# Patient Record
Sex: Female | Born: 1980 | Race: Black or African American | Hispanic: No | Marital: Single | Smoking: Former smoker
Health system: Southern US, Community
[De-identification: ages and names within clinical notes are randomized; demographics above are authoritative.]

## PROBLEM LIST (undated history)

## (undated) DIAGNOSIS — O24419 Gestational diabetes mellitus in pregnancy, unspecified control: Secondary | ICD-10-CM

## (undated) DIAGNOSIS — O039 Complete or unspecified spontaneous abortion without complication: Secondary | ICD-10-CM

## (undated) DIAGNOSIS — Z8619 Personal history of other infectious and parasitic diseases: Secondary | ICD-10-CM

## (undated) HISTORY — DX: Gestational diabetes mellitus in pregnancy, unspecified control: O24.419

## (undated) HISTORY — DX: Personal history of other infectious and parasitic diseases: Z86.19

## (undated) HISTORY — PX: DILATION AND CURETTAGE OF UTERUS: SHX78

---

## 2008-01-05 ENCOUNTER — Emergency Department (HOSPITAL_COMMUNITY): Admission: EM | Admit: 2008-01-05 | Discharge: 2008-01-06 | Payer: Self-pay | Admitting: Emergency Medicine

## 2008-01-08 ENCOUNTER — Emergency Department (HOSPITAL_COMMUNITY): Admission: EM | Admit: 2008-01-08 | Discharge: 2008-01-08 | Payer: Self-pay | Admitting: Emergency Medicine

## 2008-02-05 ENCOUNTER — Ambulatory Visit (HOSPITAL_COMMUNITY): Admission: RE | Admit: 2008-02-05 | Discharge: 2008-02-05 | Payer: Self-pay | Admitting: Obstetrics

## 2008-03-05 ENCOUNTER — Ambulatory Visit (HOSPITAL_COMMUNITY): Admission: RE | Admit: 2008-03-05 | Discharge: 2008-03-05 | Payer: Self-pay | Admitting: Obstetrics

## 2008-04-02 ENCOUNTER — Ambulatory Visit (HOSPITAL_COMMUNITY): Admission: RE | Admit: 2008-04-02 | Discharge: 2008-04-02 | Payer: Self-pay | Admitting: Obstetrics

## 2008-05-12 ENCOUNTER — Encounter: Admission: RE | Admit: 2008-05-12 | Discharge: 2008-05-12 | Payer: Self-pay | Admitting: Obstetrics

## 2008-06-28 ENCOUNTER — Ambulatory Visit (HOSPITAL_COMMUNITY): Admission: RE | Admit: 2008-06-28 | Discharge: 2008-06-28 | Payer: Self-pay | Admitting: Obstetrics

## 2008-07-03 ENCOUNTER — Inpatient Hospital Stay (HOSPITAL_COMMUNITY): Admission: AD | Admit: 2008-07-03 | Discharge: 2008-07-06 | Payer: Self-pay | Admitting: Obstetrics and Gynecology

## 2009-10-30 ENCOUNTER — Ambulatory Visit (HOSPITAL_COMMUNITY): Admission: RE | Admit: 2009-10-30 | Discharge: 2009-10-30 | Payer: Self-pay | Admitting: Obstetrics

## 2010-10-13 LAB — CBC
HCT: 37.7 % (ref 36.0–46.0)
Hemoglobin: 12.8 g/dL (ref 12.0–15.0)
Platelets: 236 10*3/uL (ref 150–400)
RBC: 4.17 MIL/uL (ref 3.87–5.11)
WBC: 6.4 10*3/uL (ref 4.0–10.5)

## 2010-10-13 LAB — TYPE AND SCREEN: Antibody Screen: NEGATIVE

## 2010-12-07 NOTE — H&P (Signed)
NAMERILEI, KRAVITZ               ACCOUNT NO.:  [id number]   MEDICAL RECORD NO.:  [id number]          PATIENT TYPE:  INP   LOCATION:  9175                          FACILITY:  [facility]   PHYSICIAN:  Lenoard Aden, M.D.DATE OF BIRTH:  04-30-1981   DATE OF ADMISSION:  07/03/2008  DATE OF DISCHARGE:                              HISTORY & PHYSICAL   CHIEF COMPLAINT:  A 39 weeks' gestation, diabetes, borderline  oligohydramnios, for induction.   She is a 30 year old white female G2, P0, now 41 weeks' gestation with  gestational diabetes, borderline oligohydramnios for cervical ripening  induction.   ALLERGIES:  She has no known drug allergies.   MEDICATIONS:  Prenatal vitamins and Glyburide, which she has not taken  for 2 days.  She is a smoker of less than a pack a day.  She denies  domestic or physical violence.   FAMILY HISTORY:  Diabetes and hypertension.   She has history of one uncomplicated spontaneous abortion with D&C in  2008.   PHYSICAL EXAMINATION:  GENERAL:  She is a well-developed, well-nourished  [place] female in no acute distress.  HEENT:  Normal.  LUNGS:  Clear.  HEART:  Regular rate and rhythm.  ABDOMEN:  Soft, gravid, and nontender. Estimated fetal weight is 7  pounds. Cervix is 2, 50%, vertex, -1.  EXTREMITIES:  There are no cords.  NEUROLOGIC:  Nonfocal.  SKIN:  Intact.   Cytotec to be placed.  NSD is reactive.   IMPRESSION:  1. A 39 weeks' obstetrics.  2. Gestational diabetes.  3. Borderline oligohydramnios.   PLAN:  Proceed with cervical ripening induction, anticipated type is  vaginal.     Lenoard Aden, M.D.  Electronically Signed    RJT/MEDQ  D:  07/03/2008  T:  07/04/2008  Job:  [id number]

## 2011-04-29 LAB — CBC
Hemoglobin: 10.7 g/dL — ABNORMAL LOW (ref 12.0–15.0)
Hemoglobin: 13.1 g/dL (ref 12.0–15.0)
Platelets: 157 10*3/uL (ref 150–400)
RBC: 4.16 MIL/uL (ref 3.87–5.11)
RDW: 13.2 % (ref 11.5–15.5)
RDW: 13.4 % (ref 11.5–15.5)
WBC: 9 10*3/uL (ref 4.0–10.5)

## 2011-04-29 LAB — GLUCOSE, CAPILLARY
Glucose-Capillary: 103 mg/dL — ABNORMAL HIGH (ref 70–99)
Glucose-Capillary: 108 mg/dL — ABNORMAL HIGH (ref 70–99)
Glucose-Capillary: 128 mg/dL — ABNORMAL HIGH (ref 70–99)
Glucose-Capillary: 88 mg/dL (ref 70–99)
Glucose-Capillary: 90 mg/dL (ref 70–99)

## 2012-08-22 ENCOUNTER — Ambulatory Visit: Payer: 59 | Admitting: Emergency Medicine

## 2012-08-22 VITALS — BP 112/76 | HR 82 | Temp 98.0°F | Resp 16 | Ht 66.5 in | Wt 173.0 lb

## 2012-08-22 DIAGNOSIS — Z3490 Encounter for supervision of normal pregnancy, unspecified, unspecified trimester: Secondary | ICD-10-CM

## 2012-08-22 DIAGNOSIS — N760 Acute vaginitis: Secondary | ICD-10-CM

## 2012-08-22 DIAGNOSIS — Z331 Pregnant state, incidental: Secondary | ICD-10-CM

## 2012-08-22 DIAGNOSIS — R350 Frequency of micturition: Secondary | ICD-10-CM

## 2012-08-22 LAB — POCT UA - MICROSCOPIC ONLY
Casts, Ur, LPF, POC: NEGATIVE
Crystals, Ur, HPF, POC: NEGATIVE
Epithelial cells, urine per micros: NEGATIVE
Mucus, UA: NEGATIVE
Yeast, UA: NEGATIVE

## 2012-08-22 LAB — POCT WET PREP WITH KOH

## 2012-08-22 LAB — POCT URINALYSIS DIPSTICK
Bilirubin, UA: NEGATIVE
Glucose, UA: NEGATIVE
Ketones, UA: NEGATIVE
Leukocytes, UA: NEGATIVE
Nitrite, UA: NEGATIVE
Protein, UA: NEGATIVE
Spec Grav, UA: 1.025
Urobilinogen, UA: 1
pH, UA: 6.5

## 2012-08-22 MED ORDER — METRONIDAZOLE 500 MG PO TABS: 500.0000 mg | ORAL_TABLET | Freq: Two times a day (BID) | ORAL | Status: DC

## 2012-08-22 MED ORDER — PRENATAL MULTIVIT-MIN-FE-FA 0.8 MG PO TABS: 1.0000 | ORAL_TABLET | Freq: Every day | ORAL | Status: AC

## 2012-08-22 NOTE — Progress Notes (Signed)
[facility] [address] [place] [address] [phone]  Date:  08/22/2012   Name:  Jasmin Mitchell   DOB:  [birth year]   MRN:  [id number]  PCP:  No primary provider on file.    Chief Complaint: Vaginal Discharge, Urinary Frequency and Vaginal Itching   History of Present Illness:  Jasmin Mitchell is a 32 y.o. very pleasant female patient who presents with the following:  Three week history of white discharge and itching and dysuria. Used a monistat insert and improved but returned.  Has frequent urination.  No fever or chills, abdominal pain, nausea or vomiting.  LMP 12/20.   Uses foam for contraception.  Sexually active since menses.    There is no problem list on file for this patient.   No past medical history on file.  No past surgical history on file.  History  Substance Use Topics  . Smoking status: Current Every Day Smoker  . Smokeless tobacco: Not on file  . Alcohol Use: Yes    No family history on file.  No Known Allergies  Medication list has been reviewed and updated.  No current outpatient prescriptions on file prior to visit.    Review of Systems:  As per HPI, otherwise negative.    Physical Examination: Filed Vitals:   08/22/12 1447  BP: 112/76  Pulse: 82  Temp: 98 F (36.7 C)  Resp: 16   Filed Vitals:   08/22/12 1447  Height: 5' 6.5" (1.689 m)  Weight: 173 lb (78.472 kg)   Body mass index is 27.50 kg/(m^2). Ideal Body Weight: Weight in (lb) to have BMI = 25: 156.9   GEN: WDWN, NAD, Non-toxic, A & O x 3 HEENT: Atraumatic, Normocephalic. Neck supple. No masses, No LAD. Ears and Nose: No external deformity. CV: RRR, No M/G/R. No JVD. No thrill. No extra heart sounds. PULM: CTA B, no wheezes, crackles, rhonchi. No retractions. No resp. distress. No accessory muscle use. ABD: S, NT, ND, +BS. No rebound. No HSM. EXTR: No c/c/e NEURO Normal gait.  PSYCH: Normally interactive. Conversant. Not depressed or anxious  appearing.  Calm demeanor.  Pelvic - normal external genitalia, vulva, vagina, cervix, uterus and adnexa.  Thick white discharge   Assessment and Plan: Pregnancy BV Flagyl Prenatal vitamins  Carmelina Dane, MD  Results for orders placed in visit on 08/22/12  POCT URINALYSIS DIPSTICK      Component Value Range   Color, UA yellow     Clarity, UA clear     Glucose, UA neg     Bilirubin, UA neg     Ketones, UA neg     Spec Grav, UA 1.025     Blood, UA trace     pH, UA 6.5     Protein, UA neg     Urobilinogen, UA 1.0     Nitrite, UA neg     Leukocytes, UA Negative    POCT UA - MICROSCOPIC ONLY      Component Value Range   WBC, Ur, HPF, POC 0-1     RBC, urine, microscopic 3-5     Bacteria, U Microscopic 1+     Mucus, UA neg     Epithelial cells, urine per micros neg     Crystals, Ur, HPF, POC neg     Casts, Ur, LPF, POC neg     Yeast, UA neg     Results for orders placed in visit on 08/22/12  POCT  URINALYSIS DIPSTICK      Component Value Range   Color, UA yellow     Clarity, UA clear     Glucose, UA neg     Bilirubin, UA neg     Ketones, UA neg     Spec Grav, UA 1.025     Blood, UA trace     pH, UA 6.5     Protein, UA neg     Urobilinogen, UA 1.0     Nitrite, UA neg     Leukocytes, UA Negative    POCT UA - MICROSCOPIC ONLY      Component Value Range   WBC, Ur, HPF, POC 0-1     RBC, urine, microscopic 3-5     Bacteria, U Microscopic 1+     Mucus, UA neg     Epithelial cells, urine per micros neg     Crystals, Ur, HPF, POC neg     Casts, Ur, LPF, POC neg     Yeast, UA neg    POCT URINE PREGNANCY      Component Value Range   Preg Test, Ur Positive    POCT WET PREP WITH KOH      Component Value Range   Trichomonas, UA Negative     Clue Cells Wet Prep HPF POC 5-8     Epithelial Wet Prep HPF POC 3-6     Yeast Wet Prep HPF POC neg     Bacteria Wet Prep HPF POC 5+     RBC Wet Prep HPF POC 0-3     WBC Wet Prep HPF POC tntc     KOH Prep POC Negative

## 2012-08-22 NOTE — Patient Instructions (Addendum)
Bacterial Vaginosis Bacterial vaginosis (BV) is a vaginal infection where the normal balance of bacteria in the vagina is disrupted. The normal balance is then replaced by an overgrowth of certain bacteria. There are several different kinds of bacteria that can cause BV. BV is the most common vaginal infection in women of childbearing age. CAUSES   The cause of BV is not fully understood. BV develops when there is an increase or imbalance of harmful bacteria.  Some activities or behaviors can upset the normal balance of bacteria in the vagina and put women at increased risk including:  Having a new sex partner or multiple sex partners.  Douching.  Using an intrauterine device (IUD) for contraception.  It is not clear what role sexual activity plays in the development of BV. However, women that have never had sexual intercourse are rarely infected with BV. Women do not get BV from toilet seats, bedding, swimming pools or from touching objects around them.  SYMPTOMS   Grey vaginal discharge.  A fish-like odor with discharge, especially after sexual intercourse.  Itching or burning of the vagina and vulva.  Burning or pain with urination.  Some women have no signs or symptoms at all. DIAGNOSIS  Your caregiver must examine the vagina for signs of BV. Your caregiver will perform lab tests and look at the sample of vaginal fluid through a microscope. They will look for bacteria and abnormal cells (clue cells), a pH test higher than 4.5, and a positive amine test all associated with BV.  RISKS AND COMPLICATIONS   Pelvic inflammatory disease (PID).  Infections following gynecology surgery.  Developing HIV.  Developing herpes virus. TREATMENT  Sometimes BV will clear up without treatment. However, all women with symptoms of BV should be treated to avoid complications, especially if gynecology surgery is planned. Female partners generally do not need to be treated. However, BV may spread  between female sex partners so treatment is helpful in preventing a recurrence of BV.   BV may be treated with antibiotics. The antibiotics come in either pill or vaginal cream forms. Either can be used with nonpregnant or pregnant women, but the recommended dosages differ. These antibiotics are not harmful to the baby.  BV can recur after treatment. If this happens, a second round of antibiotics will often be prescribed.  Treatment is important for pregnant women. If not treated, BV can cause a premature delivery, especially for a pregnant woman who had a premature birth in the past. All pregnant women who have symptoms of BV should be checked and treated.  For chronic reoccurrence of BV, treatment with a type of prescribed gel vaginally twice a week is helpful. HOME CARE INSTRUCTIONS   Finish all medication as directed by your caregiver.  Do not have sex until treatment is completed.  Tell your sexual partner that you have a vaginal infection. They should see their caregiver and be treated if they have problems, such as a mild rash or itching.  Practice safe sex. Use condoms. Only have 1 sex partner. PREVENTION  Basic prevention steps can help reduce the risk of upsetting the natural balance of bacteria in the vagina and developing BV:  Do not have sexual intercourse (be abstinent).  Do not douche.  Use all of the medicine prescribed for treatment of BV, even if the signs and symptoms go away.  Tell your sex partner if you have BV. That way, they can be treated, if needed, to prevent reoccurrence. SEEK MEDICAL CARE IF:     Your symptoms are not improving after 3 days of treatment.  You have increased discharge, pain, or fever. MAKE SURE YOU:   Understand these instructions.  Will watch your condition.  Will get help right away if you are not doing well or get worse. FOR MORE INFORMATION  Division of STD Prevention (DSTDP), Centers for Disease Control and Prevention:  www.cdc.gov/std American Social Health Association (ASHA): www.ashastd.org  Document Released: 07/11/2005 Document Revised: 10/03/2011 Document Reviewed: 01/01/2009 ExitCare Patient Information 2013 [employer].  

## 2012-08-23 NOTE — Progress Notes (Signed)
Reviewed and agree.

## 2012-08-24 LAB — URINE CULTURE: Colony Count: >=100000

## 2012-08-27 MED ORDER — SULFAMETHOXAZOLE-TRIMETHOPRIM 800-160 MG PO TABS: 1.0000 | ORAL_TABLET | Freq: Two times a day (BID) | ORAL | Status: DC

## 2012-09-17 ENCOUNTER — Emergency Department (HOSPITAL_COMMUNITY)
Admission: EM | Admit: 2012-09-17 | Discharge: 2012-09-17 | Disposition: A | Discharge status: Home / Self Care | Payer: 59 | Attending: Emergency Medicine | Admitting: Emergency Medicine

## 2012-09-17 ENCOUNTER — Emergency Department (HOSPITAL_COMMUNITY): Payer: 59

## 2012-09-17 ENCOUNTER — Encounter (HOSPITAL_COMMUNITY): Payer: Self-pay | Admitting: Emergency Medicine

## 2012-09-17 DIAGNOSIS — Z87891 Personal history of nicotine dependence: Secondary | ICD-10-CM | POA: Insufficient documentation

## 2012-09-17 DIAGNOSIS — O031 Delayed or excessive hemorrhage following incomplete spontaneous abortion: Secondary | ICD-10-CM | POA: Insufficient documentation

## 2012-09-17 HISTORY — DX: Complete or unspecified spontaneous abortion without complication: O03.9

## 2012-09-17 LAB — TYPE AND SCREEN
ABO/RH(D): O POS
Antibody Screen: NEGATIVE

## 2012-09-17 LAB — ABO/RH

## 2012-09-17 NOTE — ED Notes (Signed)
Patient transported to Ultrasound 

## 2012-09-17 NOTE — ED Provider Notes (Signed)
History     CSN: [id number]  Arrival date & time 09/17/12  1906   None     Chief Complaint  Patient presents with  . Vaginal Bleeding/Pregnant     (Consider location/radiation/quality/duration/timing/severity/associated sxs/prior treatment) HPI History provided by pt.   Based on patient's LMP, she is approximately 2 months pregnant.  Has not been evaluated by OB or had an [place] to verify IUP.  Developed vaginal spotting yesterday and has been bleeding more heavily today, soaking through 3 pads.  Developed mild, diffuse lower abdominal cramping this morning.  Has not taken anything for pain.  No associated fever, N/V/D, urinary or other vaginal sx.  Pt is G3P1, with a missed abortion in 2009.  Otherwise healthy.  Past Medical History  Diagnosis Date  . Miscarriage     Past Surgical History  Procedure Laterality Date  . Dilation and curettage of uterus      No family history on file.  History  Substance Use Topics  . Smoking status: Former Games developer  . Smokeless tobacco: Not on file  . Alcohol Use: Yes    OB History   Grav Para Term Preterm Abortions TAB SAB Ect Mult Living                  Review of Systems  All other systems reviewed and are negative.    Allergies  Review of patient's allergies indicates no known allergies.  Home Medications   Current Outpatient Rx  Name  Route  Sig  Dispense  Refill  . Prenatal Multivit-Min-Fe-FA 0.8 MG TABS   Oral   Take 1 tablet (0.8 mg total) by mouth daily.   100 each   3     BP 103/65  Pulse 61  Temp(Src) 98.4 F (36.9 C) (Oral)  SpO2 100%  LMP 07/11/2012  Physical Exam  Nursing note and vitals reviewed. Constitutional: She is oriented to person, place, and time. She appears well-developed and well-nourished. No distress.  HENT:  Head: Normocephalic and atraumatic.  Eyes:  Normal appearance  Neck: Normal range of motion.  Cardiovascular: Normal rate and regular rhythm.   Pulmonary/Chest: Effort normal and  breath sounds normal. No respiratory distress.  Abdominal: Soft. Bowel sounds are normal. She exhibits no distension and no mass. There is no rebound and no guarding.  Diffuse, mild lower abd and suprapubic ttp  Genitourinary:  No CVA tenderness  Musculoskeletal: Normal range of motion.  Neurological: She is alert and oriented to person, place, and time.  Skin: Skin is warm and dry. No rash noted.  Psychiatric: She has a normal mood and affect. Her behavior is normal.    ED Course  Procedures (including critical care time)  Labs Reviewed  HCG, QUANTITATIVE, PREGNANCY - Abnormal; Notable for the following:    hCG, Beta Chain, Quant, S 3445 (*)    All other components within normal limits  URINALYSIS, ROUTINE W REFLEX MICROSCOPIC  TYPE AND SCREEN  ABO/RH  SURGICAL PATHOLOGY   US Ob Comp Less 14 Wks  09/17/2012  *RADIOLOGY REPORT*  Clinical Data: 32 year old pregnant female with vaginal spotting/bleeding.  Estimated gestational age of [redacted] weeks 3 days by LMP.  OBSTETRIC <14 WK [place] AND TRANSVAGINAL OB US  Technique:  Both transabdominal and transvaginal ultrasound examinations were performed for complete evaluation of the gestation as well as the maternal uterus, adnexal regions, and pelvic cul-de-sac.  Transvaginal technique was performed to assess early pregnancy.  Comparison:  None  Intrauterine gestational sac:  Visualized  but within the lower uterine segment near the cervix Yolk sac: Present Embryo: Not visualized  MSD: 11.8 mm  6 w 0 d     [place] EDC: 05/13/2013  Maternal uterus/adnexae: There is no evidence of subchorionic hemorrhage. The ovaries bilaterally are unremarkable.  IMPRESSION: Single intrauterine gestational sac with yolk sac, but located within the lower uterine segment near the cervix - poor prognostic sign. No embryo identified at this time.  Estimated gestational age of [redacted] weeks 0 days by this ultrasound.   Original Report Authenticated By: Harmon Pier, M.D.    US Ob  Transvaginal  09/17/2012  *RADIOLOGY REPORT*  Clinical Data: 32 year old pregnant female with vaginal spotting/bleeding.  Estimated gestational age of [redacted] weeks 3 days by LMP.  OBSTETRIC <14 WK [place] AND TRANSVAGINAL OB US  Technique:  Both transabdominal and transvaginal ultrasound examinations were performed for complete evaluation of the gestation as well as the maternal uterus, adnexal regions, and pelvic cul-de-sac.  Transvaginal technique was performed to assess early pregnancy.  Comparison:  None  Intrauterine gestational sac:  Visualized but within the lower uterine segment near the cervix Yolk sac: Present Embryo: Not visualized  MSD: 11.8 mm  6 w 0 d     [place] EDC: 05/13/2013  Maternal uterus/adnexae: There is no evidence of subchorionic hemorrhage. The ovaries bilaterally are unremarkable.  IMPRESSION: Single intrauterine gestational sac with yolk sac, but located within the lower uterine segment near the cervix - poor prognostic sign. No embryo identified at this time.  Estimated gestational age of [redacted] weeks 0 days by this ultrasound.   Original Report Authenticated By: Harmon Pier, M.D.      1. Incomplete abortion       MDM  32yo pregnant F presents w/ vaginal bleeding and lower abdominal cramping.  Remote h/o miscarriage and otherwise healthy.  Has not had [place] to verify IUP.  On exam, NAD, VS w/in nml range, diffuse, mild lower abdominal cramping, products of conception at open cervical os and otherwise unremarkable genitalia.  Products of conception removed and sent to pathology.  [place] confirms MAB. Pt is Rh negative. Advised close f/u with her Ob/Gyn.  Return precautions discussed.  10:36 PM         Otilio Miu, PA-C 09/18/12 [phone]

## 2012-09-17 NOTE — ED Notes (Signed)
One specimen container with tissue carried to lab identified as products of conception by [place] PA

## 2012-09-17 NOTE — ED Notes (Signed)
Pt states she found out 2 weeks ago she was pregnant, she's about 2 months pregnant, started having dark red spotting yesterday and today has had bright red period type flow bleeding, changed total of 3 pads today, states having urinating had "stringy" like blood come out. Pt states having menstrual like cramps on and off. Has had miscarriage before which she states was a lot worse than this is, but she has a 30 yr old son at home also, total times of being pregnant is 3. Denies n/v/d.

## 2012-09-17 NOTE — ED Notes (Addendum)
Pt states that she found out she was pregnant a couple of weeks ago and started spotting yesterday and has had heavier bleeding today with some blood clots. Estimates that she is 2 months along. States this is her third pregnancy, 1 miscarriage and she has a son. Also c/o lower abdominal pain.

## 2012-09-18 ENCOUNTER — Encounter (HOSPITAL_COMMUNITY): Payer: Self-pay | Admitting: Emergency Medicine

## 2012-09-18 NOTE — ED Provider Notes (Signed)
Medical screening examination/treatment/procedure(s) were performed by non-physician practitioner and as supervising physician I was immediately available for consultation/collaboration.  Genisis Sonnier K Linker, MD 09/18/12 1509 

## 2012-12-26 ENCOUNTER — Other Ambulatory Visit: Payer: Self-pay | Admitting: Obstetrics and Gynecology

## 2012-12-26 ENCOUNTER — Other Ambulatory Visit (HOSPITAL_COMMUNITY)
Admission: RE | Admit: 2012-12-26 | Discharge: 2012-12-26 | Disposition: A | Discharge status: Home / Self Care | Payer: 59 | Source: Ambulatory Visit | Attending: Obstetrics and Gynecology | Admitting: Obstetrics and Gynecology

## 2012-12-26 DIAGNOSIS — Z01419 Encounter for gynecological examination (general) (routine) without abnormal findings: Secondary | ICD-10-CM | POA: Insufficient documentation

## 2014-01-07 ENCOUNTER — Other Ambulatory Visit: Payer: Self-pay | Admitting: Obstetrics and Gynecology

## 2014-01-07 ENCOUNTER — Other Ambulatory Visit (HOSPITAL_COMMUNITY)
Admission: RE | Admit: 2014-01-07 | Discharge: 2014-01-07 | Disposition: A | Discharge status: Home / Self Care | Payer: 59 | Source: Ambulatory Visit | Attending: Obstetrics and Gynecology | Admitting: Obstetrics and Gynecology

## 2014-01-07 DIAGNOSIS — Z1151 Encounter for screening for human papillomavirus (HPV): Secondary | ICD-10-CM | POA: Insufficient documentation

## 2014-01-07 DIAGNOSIS — Z01419 Encounter for gynecological examination (general) (routine) without abnormal findings: Secondary | ICD-10-CM | POA: Insufficient documentation

## 2014-01-07 DIAGNOSIS — Z113 Encounter for screening for infections with a predominantly sexual mode of transmission: Secondary | ICD-10-CM | POA: Insufficient documentation

## 2014-01-09 LAB — CYTOLOGY - PAP

## 2014-02-14 LAB — OB RESULTS CONSOLE RUBELLA ANTIBODY, IGM: Rubella: IMMUNE

## 2014-02-14 LAB — OB RESULTS CONSOLE ABO/RH: RH TYPE: POSITIVE

## 2014-02-14 LAB — OB RESULTS CONSOLE HIV ANTIBODY (ROUTINE TESTING): HIV: NONREACTIVE

## 2014-02-14 LAB — OB RESULTS CONSOLE ANTIBODY SCREEN: Antibody Screen: NEGATIVE

## 2014-02-14 LAB — OB RESULTS CONSOLE RPR: RPR: NONREACTIVE

## 2014-02-14 LAB — OB RESULTS CONSOLE HEPATITIS B SURFACE ANTIGEN: HEP B S AG: NEGATIVE

## 2014-02-14 LAB — OB RESULTS CONSOLE GC/CHLAMYDIA
Chlamydia: NEGATIVE
GC PROBE AMP, GENITAL: NEGATIVE

## 2014-07-09 ENCOUNTER — Encounter: Payer: 59 | Attending: Obstetrics and Gynecology

## 2014-07-09 VITALS — Ht 67.0 in | Wt 213.7 lb

## 2014-07-09 DIAGNOSIS — O24419 Gestational diabetes mellitus in pregnancy, unspecified control: Secondary | ICD-10-CM | POA: Diagnosis not present

## 2014-07-09 DIAGNOSIS — Z713 Dietary counseling and surveillance: Secondary | ICD-10-CM | POA: Diagnosis not present

## 2014-07-14 NOTE — Progress Notes (Signed)
  Patient was seen on 07/09/14 for Gestational Diabetes self-management . The following learning objectives were met by the patient :   States the definition of Gestational Diabetes  States why dietary management is important in controlling blood glucose  Describes the effects of carbohydrates on blood glucose levels  Demonstrates ability to create a balanced meal plan  Demonstrates carbohydrate counting   States when to check blood glucose levels  Demonstrates proper blood glucose monitoring techniques  States the effect of stress and exercise on blood glucose levels  States the importance of limiting caffeine and abstaining from alcohol and smoking  Plan:  Aim for 2 Carb Choices per meal (30 grams) +/- 1 either way for breakfast Aim for 3 Carb Choices per meal (45 grams) +/- 1 either way from lunch and dinner Aim for 1-2 Carbs per snack Begin reading food labels for Total Carbohydrate and sugar grams of foods Consider  increasing your activity level by walking daily as tolerated Begin checking BG before breakfast and 2 hours after first bit of breakfast, lunch and dinner after  as directed by MD  Take medication  as directed by MD  Blood glucose monitor given: None - initiated by OB/GYN due to GDM history  Patient instructed to monitor glucose levels: FBS: 60 - <90 2 hour: <120  Patient received the following handouts:  Nutrition Diabetes and Pregnancy  Carbohydrate Counting List  Meal Planning worksheet  Patient will be seen for follow-up as needed.

## 2014-07-25 NOTE — L&D Delivery Note (Signed)
Delivery Note At 5:34 PM a viable female was delivered via  (Presentation: occiput Anterior  ;  ).  APGAR: 8 and 9 at 1 and 5 minutes respectively , ; weight pending   .   Placenta status: delivered Intact, . 3 Vessel  Cord:  with the following complications: None.   Anesthesia: Epidural   Episiotomy:  None Lacerations:  Hemostatic periurethral no suture required  Suture Repair: NA Est. Blood Loss (mL):  200 mL   Mom to postpartum.  Baby to Couplet care / Skin to Skin   Patient desires circumcision of infant.  Jasmin Mitchell J. 09/12/2014, 5:53 PM

## 2014-08-26 LAB — OB RESULTS CONSOLE GBS: STREP GROUP B AG: NEGATIVE

## 2014-09-11 ENCOUNTER — Telehealth (HOSPITAL_COMMUNITY): Payer: Self-pay | Admitting: *Deleted

## 2014-09-11 ENCOUNTER — Other Ambulatory Visit: Payer: Self-pay | Admitting: Obstetrics and Gynecology

## 2014-09-11 ENCOUNTER — Encounter (HOSPITAL_COMMUNITY): Payer: Self-pay | Admitting: *Deleted

## 2014-09-11 NOTE — Telephone Encounter (Signed)
Preadmission screen  

## 2014-09-12 ENCOUNTER — Inpatient Hospital Stay (HOSPITAL_COMMUNITY)
Admission: RE | Admit: 2014-09-12 | Discharge: 2014-09-14 | DRG: 775 | Disposition: A | Discharge status: Home / Self Care | Payer: 59 | Source: Ambulatory Visit | Attending: Obstetrics and Gynecology | Admitting: Obstetrics and Gynecology

## 2014-09-12 ENCOUNTER — Inpatient Hospital Stay (HOSPITAL_COMMUNITY): Payer: 59 | Admitting: Anesthesiology

## 2014-09-12 ENCOUNTER — Encounter (HOSPITAL_COMMUNITY): Payer: Self-pay

## 2014-09-12 DIAGNOSIS — Z87891 Personal history of nicotine dependence: Secondary | ICD-10-CM | POA: Diagnosis not present

## 2014-09-12 DIAGNOSIS — Z79899 Other long term (current) drug therapy: Secondary | ICD-10-CM

## 2014-09-12 DIAGNOSIS — O24429 Gestational diabetes mellitus in childbirth, unspecified control: Principal | ICD-10-CM | POA: Diagnosis present

## 2014-09-12 DIAGNOSIS — O24419 Gestational diabetes mellitus in pregnancy, unspecified control: Secondary | ICD-10-CM | POA: Diagnosis present

## 2014-09-12 DIAGNOSIS — Z833 Family history of diabetes mellitus: Secondary | ICD-10-CM | POA: Diagnosis not present

## 2014-09-12 DIAGNOSIS — Z3A39 39 weeks gestation of pregnancy: Secondary | ICD-10-CM | POA: Diagnosis present

## 2014-09-12 LAB — GLUCOSE, CAPILLARY
GLUCOSE-CAPILLARY: 79 mg/dL (ref 70–99)
Glucose-Capillary: 95 mg/dL (ref 70–99)

## 2014-09-12 LAB — CBC
HCT: 38.2 % (ref 36.0–46.0)
Hemoglobin: 13.1 g/dL (ref 12.0–15.0)
MCH: 31.3 pg (ref 26.0–34.0)
MCHC: 34.3 g/dL (ref 30.0–36.0)
MCV: 91.2 fL (ref 78.0–100.0)
PLATELETS: 171 10*3/uL (ref 150–400)
RBC: 4.19 MIL/uL (ref 3.87–5.11)
RDW: 13 % (ref 11.5–15.5)
WBC: 7 10*3/uL (ref 4.0–10.5)

## 2014-09-12 LAB — TYPE AND SCREEN
ABO/RH(D): O POS
ANTIBODY SCREEN: NEGATIVE

## 2014-09-12 MED ORDER — BENZOCAINE-MENTHOL 20-0.5 % EX AERO
1.0000 "application " | INHALATION_SPRAY | CUTANEOUS | Status: DC | PRN
  Administered 2014-09-13: 1 via TOPICAL
  Filled 2014-09-12: qty 56

## 2014-09-12 MED ORDER — IBUPROFEN 600 MG PO TABS
600.0000 mg | ORAL_TABLET | Freq: Four times a day (QID) | ORAL | Status: DC
  Administered 2014-09-12 – 2014-09-14 (×7): 600 mg via ORAL
  Filled 2014-09-12 (×7): qty 1

## 2014-09-12 MED ORDER — EPHEDRINE 5 MG/ML INJ
10.0000 mg | INTRAVENOUS | Status: DC | PRN
  Filled 2014-09-12: qty 2

## 2014-09-12 MED ORDER — DIPHENHYDRAMINE HCL 50 MG/ML IJ SOLN: 12.5000 mg | INTRAMUSCULAR | Status: DC | PRN

## 2014-09-12 MED ORDER — DIBUCAINE 1 % RE OINT
1.0000 | TOPICAL_OINTMENT | RECTAL | Status: DC | PRN
Start: 2014-09-12 — End: 2014-09-14

## 2014-09-12 MED ORDER — ACETAMINOPHEN 325 MG PO TABS: 650.0000 mg | ORAL_TABLET | ORAL | Status: DC | PRN

## 2014-09-12 MED ORDER — DIPHENHYDRAMINE HCL 25 MG PO CAPS: 25.0000 mg | ORAL_CAPSULE | Freq: Four times a day (QID) | ORAL | Status: DC | PRN

## 2014-09-12 MED ORDER — OXYTOCIN 40 UNITS IN LACTATED RINGERS INFUSION - SIMPLE MED
1.0000 m[IU]/min | INTRAVENOUS | Status: DC
  Administered 2014-09-12: 2 m[IU]/min via INTRAVENOUS
  Filled 2014-09-12: qty 1000

## 2014-09-12 MED ORDER — ONDANSETRON HCL 4 MG/2ML IJ SOLN: 4.0000 mg | INTRAMUSCULAR | Status: DC | PRN

## 2014-09-12 MED ORDER — SENNOSIDES-DOCUSATE SODIUM 8.6-50 MG PO TABS
2.0000 | ORAL_TABLET | ORAL | Status: DC
  Administered 2014-09-13 (×2): 2 via ORAL
  Filled 2014-09-12 (×2): qty 2

## 2014-09-12 MED ORDER — METHYLERGONOVINE MALEATE 0.2 MG/ML IJ SOLN: 0.2000 mg | INTRAMUSCULAR | Status: DC | PRN

## 2014-09-12 MED ORDER — TERBUTALINE SULFATE 1 MG/ML IJ SOLN
0.2500 mg | Freq: Once | INTRAMUSCULAR | Status: DC | PRN
  Filled 2014-09-12: qty 1

## 2014-09-12 MED ORDER — ZOLPIDEM TARTRATE 5 MG PO TABS
5.0000 mg | ORAL_TABLET | Freq: Every evening | ORAL | Status: DC | PRN
Start: 2014-09-12 — End: 2014-09-14

## 2014-09-12 MED ORDER — WITCH HAZEL-GLYCERIN EX PADS
1.0000 | MEDICATED_PAD | CUTANEOUS | Status: DC | PRN
Start: 2014-09-12 — End: 2014-09-14

## 2014-09-12 MED ORDER — LACTATED RINGERS IV SOLN
500.0000 mL | Freq: Once | INTRAVENOUS | Status: AC
  Administered 2014-09-12: 500 mL via INTRAVENOUS

## 2014-09-12 MED ORDER — ONDANSETRON HCL 4 MG PO TABS: 4.0000 mg | ORAL_TABLET | ORAL | Status: DC | PRN

## 2014-09-12 MED ORDER — PHENYLEPHRINE 40 MCG/ML (10ML) SYRINGE FOR IV PUSH (FOR BLOOD PRESSURE SUPPORT)
80.0000 ug | PREFILLED_SYRINGE | INTRAVENOUS | Status: DC | PRN
  Filled 2014-09-12: qty 2

## 2014-09-12 MED ORDER — OXYCODONE-ACETAMINOPHEN 5-325 MG PO TABS: 2.0000 | ORAL_TABLET | ORAL | Status: DC | PRN

## 2014-09-12 MED ORDER — LACTATED RINGERS IV SOLN
INTRAVENOUS | Status: DC
  Administered 2014-09-12 (×2): via INTRAVENOUS

## 2014-09-12 MED ORDER — LACTATED RINGERS IV SOLN
INTRAVENOUS | Status: DC
Start: 2014-09-12 — End: 2014-09-12

## 2014-09-12 MED ORDER — ONDANSETRON HCL 4 MG/2ML IJ SOLN: 4.0000 mg | Freq: Four times a day (QID) | INTRAMUSCULAR | Status: DC | PRN

## 2014-09-12 MED ORDER — SIMETHICONE 80 MG PO CHEW: 80.0000 mg | CHEWABLE_TABLET | ORAL | Status: DC | PRN

## 2014-09-12 MED ORDER — BUTORPHANOL TARTRATE 1 MG/ML IJ SOLN: 1.0000 mg | INTRAMUSCULAR | Status: DC | PRN

## 2014-09-12 MED ORDER — OXYCODONE-ACETAMINOPHEN 5-325 MG PO TABS: 1.0000 | ORAL_TABLET | ORAL | Status: DC | PRN

## 2014-09-12 MED ORDER — FENTANYL 2.5 MCG/ML BUPIVACAINE 1/10 % EPIDURAL INFUSION (WH - ANES)
14.0000 mL/h | INTRAMUSCULAR | Status: DC | PRN
  Administered 2014-09-12: 14 mL/h via EPIDURAL
  Filled 2014-09-12: qty 125

## 2014-09-12 MED ORDER — LACTATED RINGERS IV SOLN: 500.0000 mL | INTRAVENOUS | Status: DC | PRN

## 2014-09-12 MED ORDER — FENTANYL 2.5 MCG/ML BUPIVACAINE 1/10 % EPIDURAL INFUSION (WH - ANES)
INTRAMUSCULAR | Status: DC | PRN
  Administered 2014-09-12: 14 mL/h via EPIDURAL

## 2014-09-12 MED ORDER — LIDOCAINE HCL (PF) 1 % IJ SOLN
INTRAMUSCULAR | Status: DC | PRN
  Administered 2014-09-12 (×2): 8 mL

## 2014-09-12 MED ORDER — PRENATAL MULTIVITAMIN CH
1.0000 | ORAL_TABLET | Freq: Every day | ORAL | Status: DC
  Administered 2014-09-13: 1 via ORAL
  Filled 2014-09-12: qty 1

## 2014-09-12 MED ORDER — CITRIC ACID-SODIUM CITRATE 334-500 MG/5ML PO SOLN: 30.0000 mL | ORAL | Status: DC | PRN

## 2014-09-12 MED ORDER — OXYTOCIN 40 UNITS IN LACTATED RINGERS INFUSION - SIMPLE MED: 62.5000 mL/h | INTRAVENOUS | Status: DC

## 2014-09-12 MED ORDER — LIDOCAINE HCL (PF) 1 % IJ SOLN
30.0000 mL | INTRAMUSCULAR | Status: DC | PRN
  Filled 2014-09-12: qty 30

## 2014-09-12 MED ORDER — OXYCODONE-ACETAMINOPHEN 5-325 MG PO TABS
1.0000 | ORAL_TABLET | ORAL | Status: DC | PRN
Start: 2014-09-12 — End: 2014-09-14

## 2014-09-12 MED ORDER — PHENYLEPHRINE 40 MCG/ML (10ML) SYRINGE FOR IV PUSH (FOR BLOOD PRESSURE SUPPORT)
80.0000 ug | PREFILLED_SYRINGE | INTRAVENOUS | Status: DC | PRN
  Filled 2014-09-12: qty 20
  Filled 2014-09-12: qty 2

## 2014-09-12 MED ORDER — LANOLIN HYDROUS EX OINT: TOPICAL_OINTMENT | CUTANEOUS | Status: DC | PRN

## 2014-09-12 MED ORDER — METHYLERGONOVINE MALEATE 0.2 MG PO TABS: 0.2000 mg | ORAL_TABLET | ORAL | Status: DC | PRN

## 2014-09-12 MED ORDER — OXYTOCIN BOLUS FROM INFUSION: 500.0000 mL | INTRAVENOUS | Status: DC

## 2014-09-12 NOTE — Anesthesia Preprocedure Evaluation (Signed)
Anesthesia Evaluation  Patient identified by MRN, date of birth, ID band Patient awake    Reviewed: Allergy & Precautions, H&P , NPO status , Patient's Chart, lab work & pertinent test results  Airway Mallampati: I  TM Distance: >3 FB Neck ROM: full    Dental no notable dental hx.    Pulmonary former smoker,    Pulmonary exam normal       Cardiovascular negative cardio ROS      Neuro/Psych negative neurological ROS  negative psych ROS   GI/Hepatic negative GI ROS, Neg liver ROS,   Endo/Other  diabetes, Well Controlled, Gestational  Renal/GU negative Renal ROS     Musculoskeletal   Abdominal (+) + obese,   Peds  Hematology negative hematology ROS (+)   Anesthesia Other Findings   Reproductive/Obstetrics (+) Pregnancy                             Anesthesia Physical Anesthesia Plan  ASA: II  Anesthesia Plan: Epidural   Post-op Pain Management:    Induction:   Airway Management Planned:   Additional Equipment:   Intra-op Plan:   Post-operative Plan:   Informed Consent: I have reviewed the patients History and Physical, chart, labs and discussed the procedure including the risks, benefits and alternatives for the proposed anesthesia with the patient or authorized representative who has indicated his/her understanding and acceptance.     Plan Discussed with:   Anesthesia Plan Comments:         Anesthesia Quick Evaluation

## 2014-09-12 NOTE — Progress Notes (Signed)
Jasmin Mitchell is a 34 y.o. [id number] at 5786w1d  admitted for induction of labor due to Gestational diabetes.  Subjective:  pt is comfortable with her epidural   Objective: BP 125/69 mmHg  Pulse 85  Temp(Src) 97.7 F (36.5 C) (Oral)  Resp 20  Ht 5\' 7"  (1.702 m)  Wt 100.699 kg (222 lb)  BMI 34.76 kg/m2  SpO2 100%  LMP 12/12/2013      FHT:  FHR: 140 bpm, variability: moderate,  accelerations:  Present,  decelerations:  Absent UC:   regular, every 2 minutes SVE:  3.5/75/-2 AROM clear fluid IUPC placed  Labs: Lab Results  Component Value Date   WBC 7.0 09/12/2014   HGB 13.1 09/12/2014   HCT 38.2 09/12/2014   MCV 91.2 09/12/2014   PLT 171 09/12/2014    Assessment / Plan: Induction of labor due to gestational diabetes,  progressing well on pitocin  Labor: progressing on pitocin  Preeclampsia:  NA Fetal Wellbeing:  Category I Pain Control:  Epidural I/D:  n/a Anticipated MOD:  NSVD  Jasmin Mitchell J. 09/12/2014, 5:51 PM

## 2014-09-12 NOTE — H&P (Signed)
Jasmin Mitchell is a 34 y.o. female presenting for induction of labor at 39 wks and 1 day due to gestational diabetes controlled with Glyburide.  + FM no lof no vaginal bleeding no contractions.     History OB History    Gravida Para Term Preterm AB TAB SAB Ectopic Multiple Living   4 1 1  2  2   1      Past Medical History  Diagnosis Date  . Miscarriage   . Gestational diabetes mellitus, antepartum   . Hx of chlamydia infection   . Hx of varicella   . Gestational diabetes    Past Surgical History  Procedure Laterality Date  . Dilation and curettage of uterus    . Dilation and curettage of uterus     Family History: family history includes Diabetes in her maternal aunt; Sickle cell anemia in her maternal grandmother. Social History:  reports that she quit smoking about 8 months ago. She has never used smokeless tobacco. She reports that she does not drink alcohol or use illicit drugs. Allergies:  Meds: Glyburide 2.5 mg daily / PNV    Prenatal Transfer Tool  Maternal Diabetes: Yes:  Diabetes Type:  Insulin/Medication controlled Genetic Screening: Normal Maternal Ultrasounds/Referrals: Normal Fetal Ultrasounds or other Referrals:  None Maternal Substance Abuse:  No Significant Maternal Medications:  Meds include: Other:  glyburide  Significant Maternal Lab Results:  Lab values include: Group B Strep negative Other Comments:  None  Review of Systems  All other systems reviewed and are negative.   Dilation: 8 Effacement (%): 100 Station: -2, -1 [facility] Blood pressure 125/69, pulse 85, temperature 97.7 F (36.5 C), temperature source Oral, resp. rate 20, height 5\' 7"  (1.702 m), weight 100.699 kg (222 lb), last menstrual period 12/12/2013, SpO2 100 %. Exam Physical Exam  Vitals reviewed. Constitutional: She is oriented to person, place, and time. She appears well-developed and well-nourished.  HENT:  Head: Normocephalic and atraumatic.  Neck: Normal range of  motion.  Cardiovascular: Normal rate and regular rhythm.   Respiratory: Effort normal and breath sounds normal.  GI: There is no tenderness.  Musculoskeletal: Normal range of motion. She exhibits no edema.  Neurological: She is alert and oriented to person, place, and time.  Skin: Skin is warm and dry.  Psychiatric: She has a normal mood and affect.    Prenatal labs: ABO, Rh: --/--/O POS (02/19 [id number]) Antibody: NEG (02/19 0735) Rubella: Immune (07/24 0000) RPR: Nonreactive (07/24 0000)  HBsAg: Negative (07/24 0000)  HIV: Non-reactive (07/24 0000)  GBS: Negative (02/02 0000)   Assessment/Plan: 39 wks and 1 day for induction due to gestatational diabetes on glyburide.  Pitocin for induction  Anticipate svd.  Epidural for pain control    Jasmin Mitchell J. 09/12/2014, 5:46 PM

## 2014-09-12 NOTE — Plan of Care (Signed)
Problem: Discharge Progression Outcomes Goal: Voids prior to transfer Outcome: Not Applicable Date Met:  09/32/67 Patient taken to bathroom to void, but reports no urge to void and unable to void.  Bladder scan reveals 24m of urine. Patient non-distended.

## 2014-09-12 NOTE — Anesthesia Procedure Notes (Signed)
Epidural Patient location during procedure: OB Start time: 09/12/2014 1:08 PM End time: 09/12/2014 1:12 PM  Staffing Anesthesiologist: Leilani AbleHATCHETT, Keno Caraway Performed by: anesthesiologist   Preanesthetic Checklist Completed: patient identified, surgical consent, pre-op evaluation, timeout performed, IV checked, risks and benefits discussed and monitors and equipment checked  Epidural Patient position: sitting Prep: site prepped and draped and DuraPrep Patient monitoring: continuous pulse ox and blood pressure Approach: midline Location: L3-L4 Injection technique: LOR air  Needle:  Needle type: Tuohy  Needle gauge: 17 G Needle length: 9 cm and 9 Needle insertion depth: 7 cm Catheter type: closed end flexible Catheter size: 19 Gauge Catheter at skin depth: 12 cm Test dose: negative and Other  Assessment Sensory level: T9 Events: blood not aspirated, injection not painful, no injection resistance, negative IV test and no paresthesia

## 2014-09-13 LAB — CBC
HCT: 36.4 % (ref 36.0–46.0)
Hemoglobin: 12.6 g/dL (ref 12.0–15.0)
MCH: 32 pg (ref 26.0–34.0)
MCHC: 34.6 g/dL (ref 30.0–36.0)
MCV: 92.4 fL (ref 78.0–100.0)
Platelets: 173 10*3/uL (ref 150–400)
RBC: 3.94 MIL/uL (ref 3.87–5.11)
RDW: 12.9 % (ref 11.5–15.5)
WBC: 12.2 10*3/uL — AB (ref 4.0–10.5)

## 2014-09-13 LAB — RPR: RPR Ser Ql: NONREACTIVE

## 2014-09-13 NOTE — Lactation Note (Signed)
This note was copied from the chart of Jasmin Mitchell Daniel. Lactation Consultation Note Experienced BF mom for 4 months to her 5-6 yr. Old son. Denies any challenges. Had company in room. Stated she knew how to hand express and she has colostrum. States this baby is BF well no. Is on DPT and takes baby off of lights during BF. States just BF for 20 min. Well. Denies painful latching. States has good everted nipples for deep latching. (I didn't get to assess breast d/t company). Mom encouraged to feed baby 8-12 times/24 hours and with feeding cues. Mom encouraged to waken baby for feeds. Mom encouraged to do skin-to-skin.   Educated about newborn behavior and baby's w/jaundice can be sleepy and not want to feed as much. Encouraged BF to help w/jaundice.  Discussed I&O. Referred to Baby and Me Book in Breastfeeding section Pg. 22-23 for position options and Proper latch demonstration. WH/LC brochure given w/resources, support groups and LC services. Encouraged mom to call for assistance in waking baby if she can't stimulate him to BF.  Patient Name: Jasmin Mitchell Roselli HYQMV'HToday's Date: 09/13/2014 Reason for consult: Initial assessment   Maternal Data Has patient been taught Hand Expression?: Yes Does the patient have breastfeeding experience prior to this delivery?: Yes  Feeding    LATCH Score/Interventions                      Lactation Tools Discussed/Used     Consult Status Consult Status: Follow-up Date: 09/14/14 Follow-up type: In-patient    Charyl DancerCARVER, Aubrianna Orchard G 09/13/2014, 4:27 PM

## 2014-09-13 NOTE — Progress Notes (Signed)
Jasmin Mitchell  Post Partum Day 1:S/P SVD with hemostatic PeriUrethral Laceration  Subjective: Patient up ad lib, denies syncope or dizziness. Reports consuming regular diet without issues and denies N/V. Denies issues with urination and reports bleeding is "not heavy."  Patient is breastingfeeding.  Unsure of postpartum contraception plans.  Pain is being appropriately managed with use of ibuprofen.  Objective: Filed Vitals:   09/12/14 1933 09/12/14 2035 09/12/14 2135 09/13/14 0135  BP: 104/59 118/59 105/63 109/61  Pulse: 62 58 63 64  Temp:  98.5 F (36.9 C) 98.4 F (36.9 C) 98.7 F (37.1 C)  TempSrc:  Oral Oral Oral  Resp: 20 18 18 16   Height:      Weight:      SpO2:  100% 99% 99%    Recent Labs  09/12/14 0735 09/13/14 0629  HGB 13.1 12.6  HCT 38.2 36.4    Physical Exam:  General: alert, cooperative and no distress Mood/Affect: Appropriate Lungs: clear to auscultation, no wheezes, rales or rhonchi, symmetric air entry.  Heart: normal rate and regular rhythm. Abdomen:  + bowel sounds, Soft, NT Uterine Fundus: firm at umbilicus Lochia: appropriate Laceration: Not assessed Skin: Warm, Dry. DVT Evaluation: No evidence of DVT seen on physical exam. No significant calf/ankle edema.  Assessment S/P Vaginal Delivery-Day 1 Normal [facility] BreastFeeding  Plan: Infant currently under bili lights Discussed plan for discharge tomorrow  Continue current care Dr. Lance MorinA. Roberts to be updated on patient status   Jasmin Mitchell, Jasmin FasterJESSICA LYNN, MSN, CNM 09/13/2014, 8:47 AM

## 2014-09-13 NOTE — Anesthesia Postprocedure Evaluation (Signed)
Anesthesia Post Note  Patient: Jasmin Mitchell  Procedure(s) Performed: * No procedures listed *  Anesthesia type: Epidural  Patient location: Mother/Baby  Post pain: Pain level controlled  Post assessment: Post-op Vital signs reviewed  Last Vitals:  Filed Vitals:   09/13/14 0925  BP: 107/64  Pulse: 62  Temp: 36.8 C  Resp: 18    Post vital signs: Reviewed  Level of consciousness:alert  Complications: No apparent anesthesia complications

## 2014-09-14 MED ORDER — OXYCODONE-ACETAMINOPHEN 5-325 MG PO TABS: 1.0000 | ORAL_TABLET | ORAL | Status: AC | PRN

## 2014-09-14 MED ORDER — IBUPROFEN 600 MG PO TABS: 600.0000 mg | ORAL_TABLET | Freq: Four times a day (QID) | ORAL | Status: AC

## 2014-09-14 NOTE — Discharge Summary (Signed)
Vaginal Delivery Discharge Summary  ALL information will be verified prior to discharge  Jasmin Mitchell  DOB:    [birth year] MRN:    [id number] CSN:    [id number]  Date of admission:                  09/12/14  Date of discharge:                   09/14/14  Procedures this admission: SVD  Date of Delivery: 09/12/14  Newborn Data:  Live born  Information for the patient's newborn:  Jasmin Mitchell [[id number]]  female  Live born female  Birth Weight: 6 lb 3.1 oz (2810 g) APGAR: 8, 9  Home with mother. Name: Jasmin Mitchell Circumcision Plan: IP  History of Present Illness: Ms. Jasmin Mitchell is a 34 y.o. female, [phone], who presents at [redacted]w[redacted]d weeks gestation. The patient has been followed at the [facility] Gy[facility]cology division of [facility]. She was admitted onset of labor. Her pregnancy has been complicated by:  There are no active problems to display for this patient.   Hospital course: The patient was admitted for induction of labor at 39 wks and 1 day due to gestational diabetes  .   Her labor was not complicated. She proceeded to have a vaginal delivery of a healthy infant. Her delivery was not complicated. Her postpartum course was not complicated. She was discharged to home on postpartum day 2 doing well.  Feeding: breast  Contraception: unsure, will discuss at 6 week visit  Discharge hemoglobin: HEMOGLOBIN  Date Value Ref Range Status  09/13/2014 12.6 12.0 - 15.0 g/dL Final   HCT  Date Value Ref Range Status  09/13/2014 36.4 36.0 - 46.0 % Final    PreNatal Labs ABO, Rh: --/--/O POS (02/19 0735)   Antibody: NEG (02/19 0735) Rubella:   immune RPR: Non Reactive (02/19 0735)  HBsAg: Negative (07/24 0000)  HIV: Non-reactive (07/24 0000)  GBS: Negative (02/02 0000)  Discharge Physical Exam:  General: alert and cooperative Lochia: appropriate Uterine Fundus: firm Incision: healing well DVT Evaluation: No evidence of DVT seen on  physical exam.  Intrapartum Procedures: spontaneous vaginal delivery and GBS prophylaxis Postpartum Procedures: none Complications-Operative and Postpartum: Hemostatic periurethral no suture required   Discharge Diagnoses: Term Pregnancy-delivered  Activity:           pelvic rest Diet:                routine Medications: PNV, Ibuprofen and Percocet Condition:      stable     Postpartum Teaching: Nutrition, exercise, return to work or school, family visits, sexual activity, home rest, vaginal bleeding, pelvic rest, family planning, s/s of PPD, breast care peri-care and incision care   Discharge to: home  Follow-up Information    Follow up with [facility]. Schedule an appointment as soon as possible for a visit in 6 weeks.   Specialty:  Obstetrics and Gynecology   Why:  Postpartum check up, Call with any questions or concerns   Contact information:   [address] [place] [address] [phone]        Mahaila Tischer, CNM, MSN 09/14/2014. 7:55 AM   Postpartum Care After Vaginal Delivery  After you deliver your newborn (postpartum period), the usual stay in the hospital is 24 72 hours. If there were problems with your labor or delivery, or if you have other medical problems, you  might be in the hospital longer.  While you are in the hospital, you will receive help and instructions on how to care for yourself and your newborn during the postpartum period.  While you are in the hospital:  Be sure to tell your nurses if you have pain or discomfort, as well as where you feel the pain and what makes the pain worse.  If you had an incision made near your vagina (episiotomy) or if you had some tearing during delivery, the nurses may put ice packs on your episiotomy or tear. The ice packs may help to reduce the pain and swelling.  If you are breastfeeding, you may feel uncomfortable contractions of your uterus for a couple of weeks. This is  normal. The contractions help your uterus get back to normal size.  It is normal to have some bleeding after delivery.  For the first 1 3 days after delivery, the flow is red and the amount may be similar to a period.  It is common for the flow to start and stop.  In the first few days, you may pass some small clots. Let your nurses know if you begin to pass large clots or your flow increases.  Do not  flush blood clots down the toilet before having the nurse look at them.  During the next 3 10 days after delivery, your flow should become more watery and pink or brown-tinged in color.  Ten to fourteen days after delivery, your flow should be a small amount of yellowish-white discharge.  The amount of your flow will decrease over the first few weeks after delivery. Your flow may stop in 6 8 weeks. Most women have had their flow stop by 12 weeks after delivery.  You should change your sanitary pads frequently.  Wash your hands thoroughly with soap and water for at least 20 seconds after changing pads, using the toilet, or before holding or feeding your newborn.  You should feel like you need to empty your bladder within the first 6 8 hours after delivery.  In case you become weak, lightheaded, or faint, call your nurse before you get out of bed for the first time and before you take a shower for the first time.  Within the first few days after delivery, your breasts may begin to feel tender and full. This is called engorgement. Breast tenderness usually goes away within 48 72 hours after engorgement occurs. You may also notice milk leaking from your breasts. If you are not breastfeeding, do not stimulate your breasts. Breast stimulation can make your breasts produce more milk.  Spending as much time as possible with your newborn is very important. During this time, you and your newborn can feel close and get to know each other. Having your newborn stay in your room (rooming in) will help to  strengthen the bond with your newborn. It will give you time to get to know your newborn and become comfortable caring for your newborn.  Your hormones change after delivery. Sometimes the hormone changes can temporarily cause you to feel sad or tearful. These feelings should not last more than a few days. If these feelings last longer than that, you should talk to your caregiver.  If desired, talk to your caregiver about methods of family planning or contraception.  Talk to your caregiver about immunizations. Your caregiver may want you to have the following immunizations before leaving the hospital:  Tetanus, diphtheria, and pertussis (Tdap) or tetanus and diphtheria (Td)  immunization. It is very important that you and your family (including grandparents) or others caring for your newborn are up-to-date with the Tdap or Td immunizations. The Tdap or Td immunization can help protect your newborn from getting ill.  Rubella immunization.  Varicella (chickenpox) immunization.  Influenza immunization. You should receive this annual immunization if you did not receive the immunization during your pregnancy. Document Released: 05/08/2007 Document Revised: 04/04/2012 Document Reviewed: 03/07/2012 [facility] Patient Information 2014 [place], [place].   Postpartum Depression and Baby Blues  The postpartum period begins right after the birth of a baby. During this time, there is often a great amount of joy and excitement. It is also a time of considerable changes in the life of the parent(s). Regardless of how many times a mother gives birth, each child brings new challenges and dynamics to the family. It is not unusual to have feelings of excitement accompanied by confusing shifts in moods, emotions, and thoughts. All mothers are at risk of developing postpartum depression or the "baby blues." These mood changes can occur right after giving birth, or they may occur many months after giving birth. The baby  blues or postpartum depression can be mild or severe. Additionally, postpartum depression can resolve rather quickly, or it can be a long-term condition. CAUSES Elevated hormones and their rapid decline are thought to be a main cause of postpartum depression and the baby blues. There are a number of hormones that radically change during and after pregnancy. Estrogen and progesterone usually decrease immediately after delivering your baby. The level of thyroid hormone and various cortisol steroids also rapidly drop. Other factors that play a major role in these changes include major life events and genetics.  RISK FACTORS If you have any of the following risks for the baby blues or postpartum depression, know what symptoms to watch out for during the postpartum period. Risk factors that may increase the likelihood of getting the baby blues or postpartum depression include: 1. Havinga personal or family history of depression. 2. Having depression while being pregnant. 3. Having premenstrual or oral contraceptive-associated mood issues. 4. Having exceptional life stress. 5. Having marital conflict. 6. Lacking a social support network. 7. Having a baby with special needs. 8. Having health problems such as diabetes. SYMPTOMS Baby blues symptoms include:  Brief fluctuations in mood, such as going from extreme happiness to sadness.  Decreased concentration.  Difficulty sleeping.  Crying spells, tearfulness.  Irritability.  Anxiety. Postpartum depression symptoms typically begin within the first month after giving birth. These symptoms include:  Difficulty sleeping or excessive sleepiness.  Marked weight loss.  Agitation.  Feelings of worthlessness.  Lack of interest in activity or food. Postpartum psychosis is a very concerning condition and can be dangerous. Fortunately, it is rare. Displaying any of the following symptoms is cause for immediate medical attention. Postpartum  psychosis symptoms include:  Hallucinations and delusions.  Bizarre or disorganized behavior.  Confusion or disorientation. DIAGNOSIS  A diagnosis is made by an evaluation of your symptoms. There are no medical or lab tests that lead to a diagnosis, but there are various questionnaires that a caregiver may use to identify those with the baby blues, postpartum depression, or psychosis. Often times, a screening tool called the [place] Postnatal Depression Scale is used to diagnose depression in the postpartum period.  TREATMENT The baby blues usually goes away on its own in 1 to 2 weeks. Social support is often all that is needed. You should be encouraged to get adequate  sleep and rest. Occasionally, you may be given medicines to help you sleep.  Postpartum depression requires treatment as it can last several months or longer if it is not treated. Treatment may include individual or group therapy, medicine, or both to address any social, physiological, and psychological factors that may play a role in the depression. Regular exercise, a healthy diet, rest, and social support may also be strongly recommended.  Postpartum psychosis is more serious and needs treatment right away. Hospitalization is often needed. HOME CARE INSTRUCTIONS  Get as much rest as you can. Nap when the baby sleeps.  Exercise regularly. Some women find yoga and walking to be beneficial.  Eat a balanced and nourishing diet.  Do little things that you enjoy. Have a cup of tea, take a bubble bath, read your favorite magazine, or listen to your favorite music.  Avoid alcohol.  Ask for help with household chores, cooking, grocery shopping, or running errands as needed. Do not try to do everything.  Talk to people close to you about how you are feeling. Get support from your partner, family members, friends, or other new moms.  Try to stay positive in how you think. Think about the things you are grateful for.  Do not  spend a lot of time alone.  Only take medicine as directed by your caregiver.  Keep all your postpartum appointments.  Let your caregiver know if you have any concerns. SEEK MEDICAL CARE IF: You are having a reaction or problems with your medicine. SEEK IMMEDIATE MEDICAL CARE IF:  You have suicidal feelings.  You feel you may harm the baby or someone else. Document Released: 04/14/2004 Document Revised: 10/03/2011 Document Reviewed: 05/17/2011 [facility] Patient Information 2014 [place], [place].     Breastfeeding Deciding to breastfeed is one of the best choices you can make for you and your baby. A change in hormones during pregnancy causes your breast tissue to grow and increases the number and size of your milk ducts. These hormones also allow proteins, sugars, and fats from your blood supply to make breast milk in your milk-producing glands. Hormones prevent breast milk from being released before your baby is born as well as prompt milk flow after birth. Once breastfeeding has begun, thoughts of your baby, as well as his or her sucking or crying, can stimulate the release of milk from your milk-producing glands.  BENEFITS OF BREASTFEEDING For Your Baby  Your first milk (colostrum) helps your baby's digestive system function better.   There are antibodies in your milk that help your baby fight off infections.   Your baby has a lower incidence of asthma, allergies, and sudden infant death syndrome.   The nutrients in breast milk are better for your baby than infant formulas and are designed uniquely for your baby's needs.   Breast milk improves your baby's brain development.   Your baby is less likely to develop other conditions, such as childhood obesity, asthma, or type 2 diabetes mellitus.  For You   Breastfeeding helps to create a very special bond between you and your baby.   Breastfeeding is convenient. Breast milk is always available at the correct temperature  and costs nothing.   Breastfeeding helps to burn calories and helps you lose the weight gained during pregnancy.   Breastfeeding makes your uterus contract to its prepregnancy size faster and slows bleeding (lochia) after you give birth.   Breastfeeding helps to lower your risk of developing type 2 diabetes mellitus, osteoporosis, and breast  or ovarian cancer later in life. SIGNS THAT YOUR BABY IS HUNGRY Early Signs of Hunger  Increased alertness or activity.  Stretching.  Movement of the head from side to side.  Movement of the head and opening of the mouth when the corner of the mouth or cheek is stroked (rooting).  Increased sucking sounds, smacking lips, cooing, sighing, or squeaking.  Hand-to-mouth movements.  Increased sucking of fingers or hands. Late Signs of Hunger  Fussing.  Intermittent crying. Extreme Signs of Hunger Signs of extreme hunger will require calming and consoling before your baby will be able to breastfeed successfully. Do not wait for the following signs of extreme hunger to occur before you initiate breastfeeding:   Restlessness.  A loud, strong cry.   Screaming.   BREASTFEEDING BASICS Breastfeeding Initiation  Find a comfortable place to sit or lie down, with your neck and back well supported.  Place a pillow or rolled up blanket under your baby to bring him or her to the level of your breast (if you are seated). Nursing pillows are specially designed to help support your arms and your baby while you breastfeed.  Make sure that your baby's abdomen is facing your abdomen.   Gently massage your breast. With your fingertips, massage from your chest wall toward your nipple in a circular motion. This encourages milk flow. You may need to continue this action during the feeding if your milk flows slowly.  Support your breast with 4 fingers underneath and your thumb above your nipple. Make sure your fingers are well away from your nipple and  your baby's mouth.   Stroke your baby's lips gently with your finger or nipple.   When your baby's mouth is open wide enough, quickly bring your baby to your breast, placing your entire nipple and as much of the colored area around your nipple (areola) as possible into your baby's mouth.   More areola should be visible above your baby's upper lip than below the lower lip.   Your baby's tongue should be between his or her lower gum and your breast.   Ensure that your baby's mouth is correctly positioned around your nipple (latched). Your baby's lips should create a seal on your breast and be turned out (everted).  It is common for your baby to suck about 2-3 minutes in order to start the flow of breast milk. Latching Teaching your baby how to latch on to your breast properly is very important. An improper latch can cause nipple pain and decreased milk supply for you and poor weight gain in your baby. Also, if your baby is not latched onto your nipple properly, he or she may swallow some air during feeding. This can make your baby fussy. Burping your baby when you switch breasts during the feeding can help to get rid of the air. However, teaching your baby to latch on properly is still the best way to prevent fussiness from swallowing air while breastfeeding. Signs that your baby has successfully latched on to your nipple:    Silent tugging or silent sucking, without causing you pain.   Swallowing heard between every 3-4 sucks.    Muscle movement above and in front of his or her ears while sucking.  Signs that your baby has not successfully latched on to nipple:   Sucking sounds or smacking sounds from your baby while breastfeeding.  Nipple pain. If you think your baby has not latched on correctly, slip your finger into the corner of  your baby's mouth to break the suction and place it between your baby's gums. Attempt breastfeeding initiation again. Signs of Successful  Breastfeeding Signs from your baby:   A gradual decrease in the number of sucks or complete cessation of sucking.   Falling asleep.   Relaxation of his or her body.   Retention of a small amount of milk in his or her mouth.   Letting go of your breast by himself or herself. Signs from you:  Breasts that have increased in firmness, weight, and size 1-3 hours after feeding.   Breasts that are softer immediately after breastfeeding.  Increased milk volume, as well as a change in milk consistency and color by the fifth day of breastfeeding.   Nipples that are not sore, cracked, or bleeding. Signs That Your Pecola Leisure is Getting Enough Milk  Wetting at least 3 diapers in a 24-hour period. The urine should be clear and pale yellow by age 62 days.  At least 3 stools in a 24-hour period by age 62 days. The stool should be soft and yellow.  At least 3 stools in a 24-hour period by age 36 days. The stool should be seedy and yellow.  No loss of weight greater than 10% of birth weight during the first 49 days of age.  Average weight gain of 4-7 ounces (113-198 g) per week after age 397 days.  Consistent daily weight gain by age 62 days, without weight loss after the age of 2 weeks. After a feeding, your baby may spit up a small amount. This is common. BREASTFEEDING FREQUENCY AND DURATION Frequent feeding will help you make more milk and can prevent sore nipples and breast engorgement. Breastfeed when you feel the need to reduce the fullness of your breasts or when your baby shows signs of hunger. This is called "breastfeeding on demand." Avoid introducing a pacifier to your baby while you are working to establish breastfeeding (the first 4-6 weeks after your baby is born). After this time you may choose to use a pacifier. Research has shown that pacifier use during the first year of a baby's life decreases the risk of sudden infant death syndrome (SIDS). Allow your baby to feed on each breast as  long as he or she wants. Breastfeed until your baby is finished feeding. When your baby unlatches or falls asleep while feeding from the first breast, offer the second breast. Because newborns are often sleepy in the first few weeks of life, you may need to awaken your baby to get him or her to feed. Breastfeeding times will vary from baby to baby. However, the following rules can serve as a guide to help you ensure that your baby is properly fed:  Newborns (babies 67 weeks of age or younger) may breastfeed every 1-3 hours.  Newborns should not go longer than 3 hours during the day or 5 hours during the night without breastfeeding.  You should breastfeed your baby a minimum of 8 times in a 24-hour period until you begin to introduce solid foods to your baby at around 92 months of age. BREAST MILK PUMPING Pumping and storing breast milk allows you to ensure that your baby is exclusively fed your breast milk, even at times when you are unable to breastfeed. This is especially important if you are going back to work while you are still breastfeeding or when you are not able to be present during feedings. Your lactation consultant can give you guidelines on how long it is  safe to store breast milk.  A breast pump is a machine that allows you to pump milk from your breast into a sterile bottle. The pumped breast milk can then be stored in a refrigerator or freezer. Some breast pumps are operated by hand, while others use electricity. Ask your lactation consultant which type will work best for you. Breast pumps can be purchased, but some hospitals and breastfeeding support groups lease breast pumps on a monthly basis. A lactation consultant can teach you how to hand express breast milk, if you prefer not to use a pump.  CARING FOR YOUR BREASTS WHILE YOU BREASTFEED Nipples can become dry, cracked, and sore while breastfeeding. The following recommendations can help keep your breasts moisturized and  healthy:  Avoid using soap on your nipples.   Wear a supportive bra. Although not required, special nursing bras and tank tops are designed to allow access to your breasts for breastfeeding without taking off your entire bra or top. Avoid wearing underwire-style bras or extremely tight bras.  Air dry your nipples for 3-43minutes after each feeding.   Use only cotton bra pads to absorb leaked breast milk. Leaking of breast milk between feedings is normal.   Use lanolin on your nipples after breastfeeding. Lanolin helps to maintain your skin's normal moisture barrier. If you use pure lanolin, you do not need to wash it off before feeding your baby again. Pure lanolin is not toxic to your baby. You may also hand express a few drops of breast milk and gently massage that milk into your nipples and allow the milk to air dry. In the first few weeks after giving birth, some women experience extremely full breasts (engorgement). Engorgement can make your breasts feel heavy, warm, and tender to the touch. Engorgement peaks within 3-5 days after you give birth. The following recommendations can help ease engorgement:  Completely empty your breasts while breastfeeding or pumping. You may want to start by applying warm, moist heat (in the shower or with warm water-soaked hand towels) just before feeding or pumping. This increases circulation and helps the milk flow. If your baby does not completely empty your breasts while breastfeeding, pump any extra milk after he or she is finished.  Wear a snug bra (nursing or regular) or tank top for 1-2 days to signal your body to slightly decrease milk production.  Apply ice packs to your breasts, unless this is too uncomfortable for you.  Make sure that your baby is latched on and positioned properly while breastfeeding. If engorgement persists after 48 hours of following these recommendations, contact your health care provider or a Advertising copywriter. OVERALL  HEALTH CARE RECOMMENDATIONS WHILE BREASTFEEDING  Eat healthy foods. Alternate between meals and snacks, eating 3 of each per day. Because what you eat affects your breast milk, some of the foods may make your baby more irritable than usual. Avoid eating these foods if you are sure that they are negatively affecting your baby.  Drink milk, fruit juice, and water to satisfy your thirst (about 10 glasses a day).   Rest often, relax, and continue to take your prenatal vitamins to prevent fatigue, stress, and anemia.  Continue breast self-awareness checks.  Avoid chewing and smoking tobacco.  Avoid alcohol and drug use. Some medicines that may be harmful to your baby can pass through breast milk. It is important to ask your health care provider before taking any medicine, including all over-the-counter and prescription medicine as well as vitamin and herbal supplements.  It is possible to become pregnant while breastfeeding. If birth control is desired, ask your health care provider about options that will be safe for your baby. SEEK MEDICAL CARE IF:   You feel like you want to stop breastfeeding or have become frustrated with breastfeeding.  You have painful breasts or nipples.  Your nipples are cracked or bleeding.  Your breasts are red, tender, or warm.  You have a swollen area on either breast.  You have a fever or chills.  You have nausea or vomiting.  You have drainage other than breast milk from your nipples.  Your breasts do not become full before feedings by the fifth day after you give birth.  You feel sad and depressed.  Your baby is too sleepy to eat well.  Your baby is having trouble sleeping.   Your baby is wetting less than 3 diapers in a 24-hour period.  Your baby has less than 3 stools in a 24-hour period.  Your baby's skin or the white part of his or her eyes becomes yellow.   Your baby is not gaining weight by 22 days of age. SEEK IMMEDIATE MEDICAL CARE  IF:   Your baby is overly tired (lethargic) and does not want to wake up and feed.  Your baby develops an unexplained fever. Document Released: 07/11/2005 Document Revised: 07/16/2013 Document Reviewed: 01/02/2013 [facility] Patient Information 2015 [place], [place]. This information is not intended to replace advice given to you by your health care provider. Make sure you discuss any questions you have with your health care provider.

## 2014-09-14 NOTE — Lactation Note (Signed)
This note was copied from the chart of Jasmin Mitchell Staron. Lactation Consultation Note: Follow up visit with mom. She reports that baby has been feeding well with no pain. Baby has been under phototherapy- now off. She reports that he is waking better. No questions at present. To call prn  Patient Name: Jasmin Mitchell Tibbitts [id number]'s Date: 09/14/2014 Reason for consult: Follow-up assessment   Maternal Data Formula Feeding for Exclusion: No Does the patient have breastfeeding experience prior to this delivery?: Yes  Feeding   LATCH Score/Interventions                      Lactation Tools Discussed/Used     Consult Status Consult Status: Complete    Pamelia HoitWeeks, [place] Provencio D 09/14/2014, 10:04 AM

## 2014-11-28 IMAGING — US US OB TRANSVAGINAL
1 series · 14 of 28 positions shown · non-contrast
Comparison: None

CLINICAL DATA: 31-year-old pregnant female with vaginal
spotting/bleeding.  Estimated gestational age of 9 weeks 3 days by
LMP.

OBSTETRIC <14 WK US AND TRANSVAGINAL OB US
TECHNIQUE: Both transabdominal and transvaginal ultrasound
examinations were performed for complete evaluation of the
gestation as well as the maternal uterus, adnexal regions, and
pelvic cul-de-sac.  Transvaginal technique was performed to assess
early pregnancy.

[Series 1: us ob transvaginal · 0.30mm/px · 72 acquisitions, 14 frames shown]
[im 3/72]
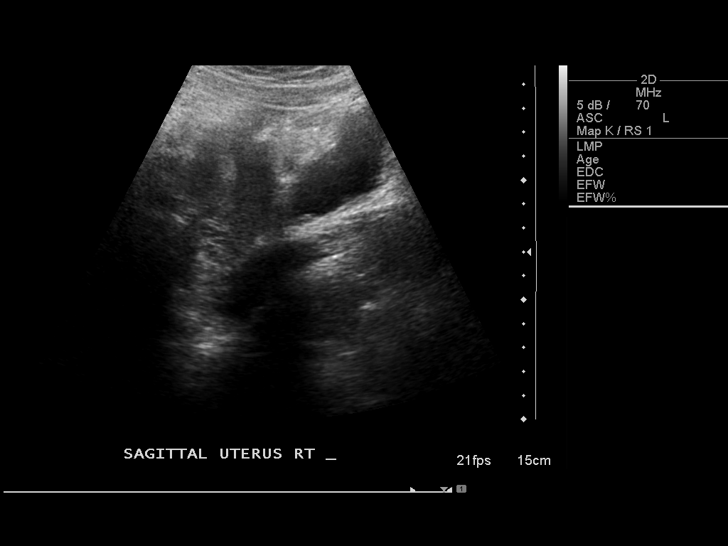
[im 8/72]
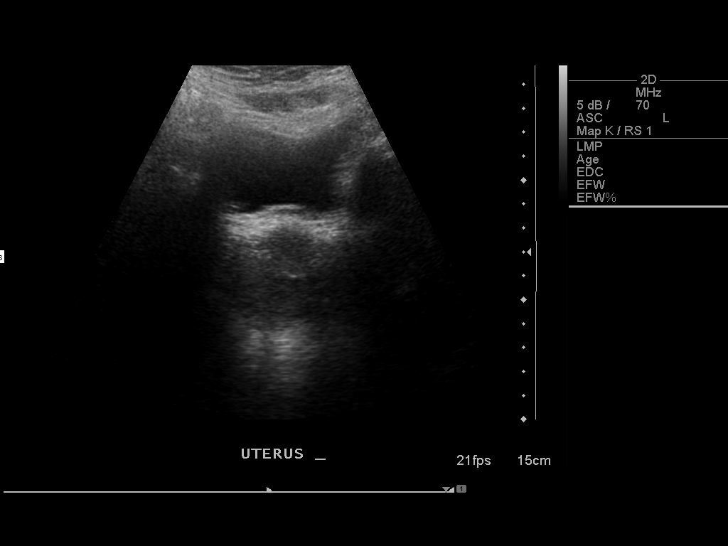
[im 14/72]
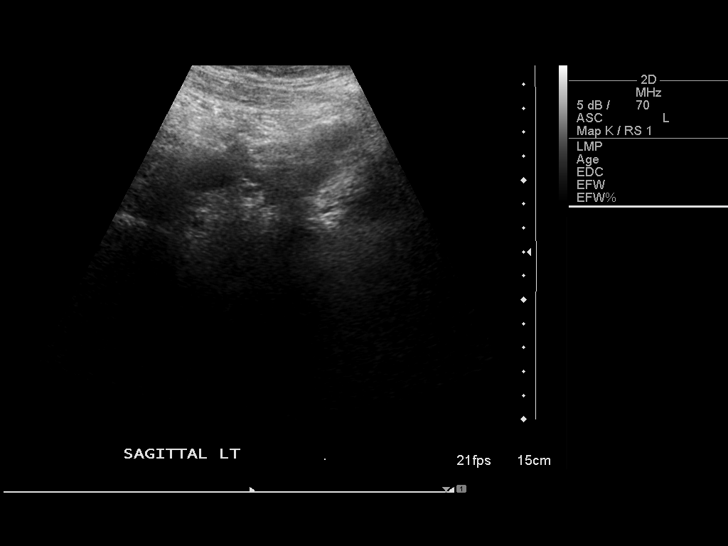
[im 19/72]
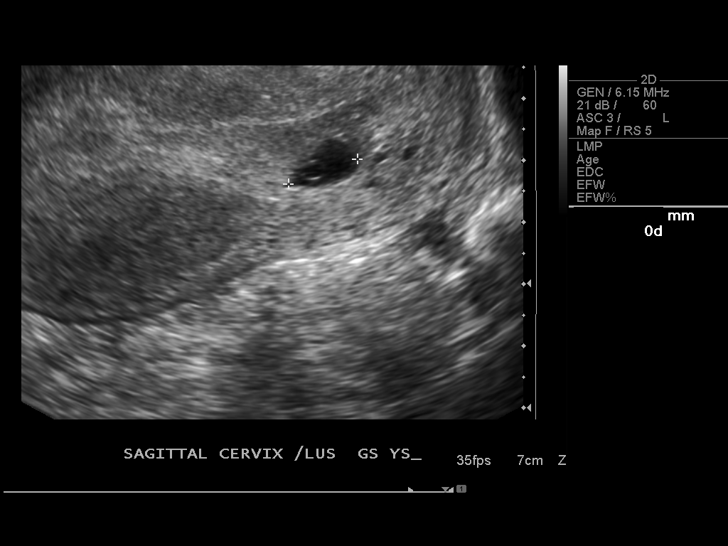
[im 24/72]
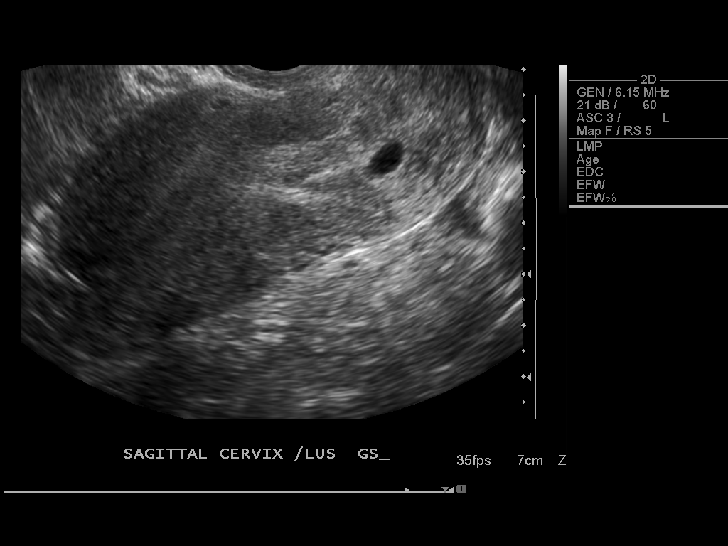
[im 29/72]
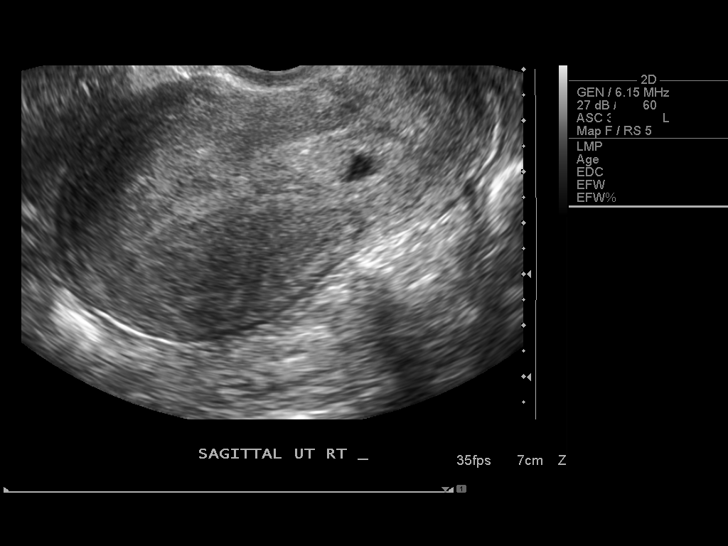
[im 35/72]
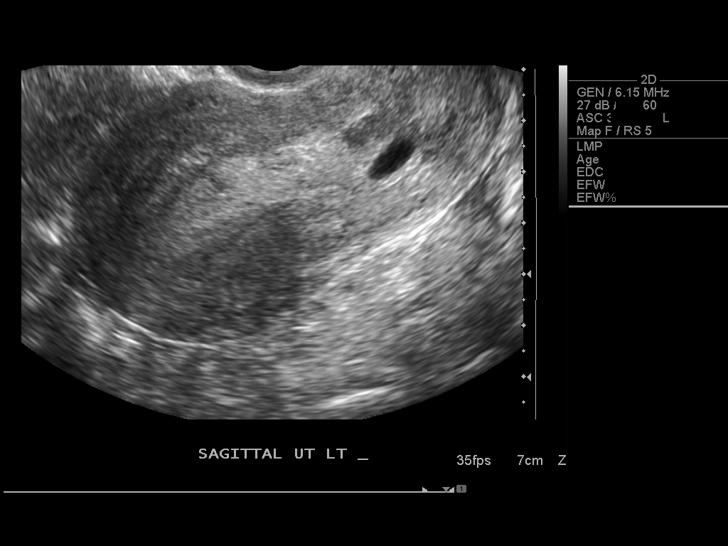
[im 40/72]
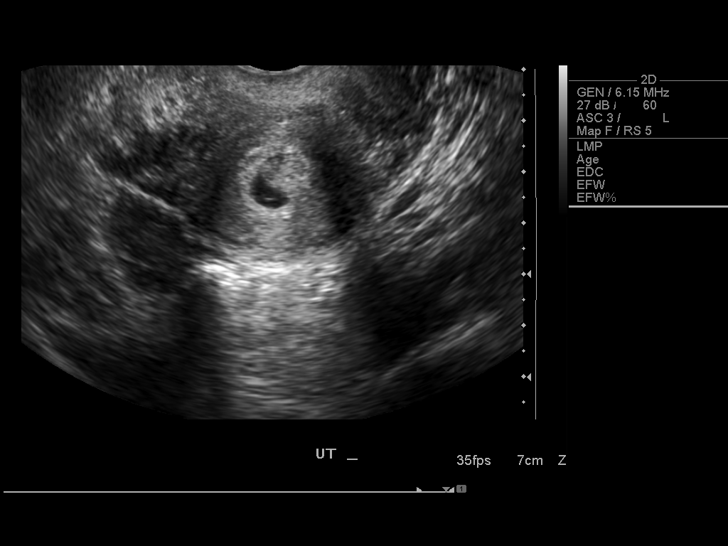
[im 45/72]
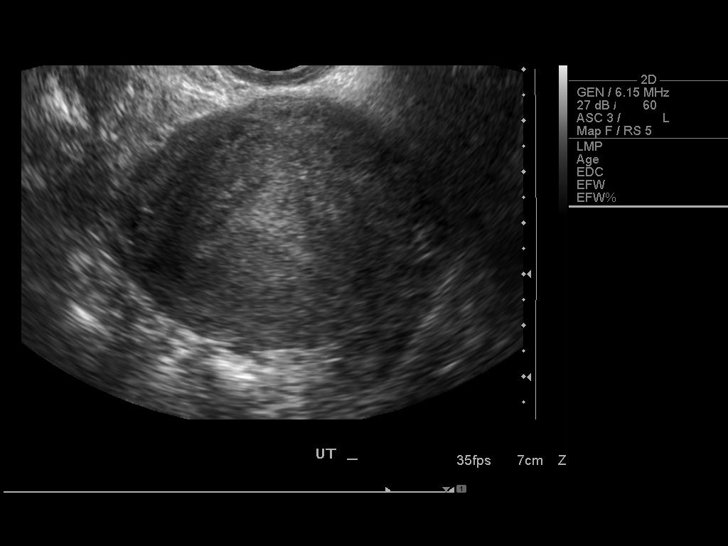
[im 50/72]
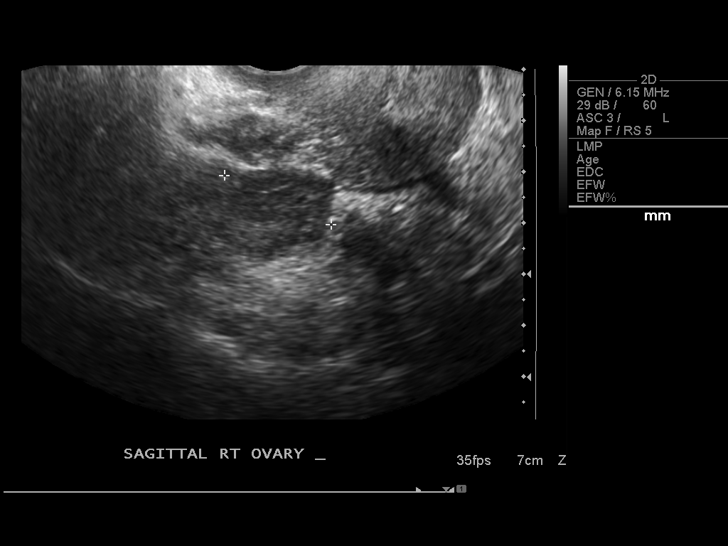
[im 56/72]
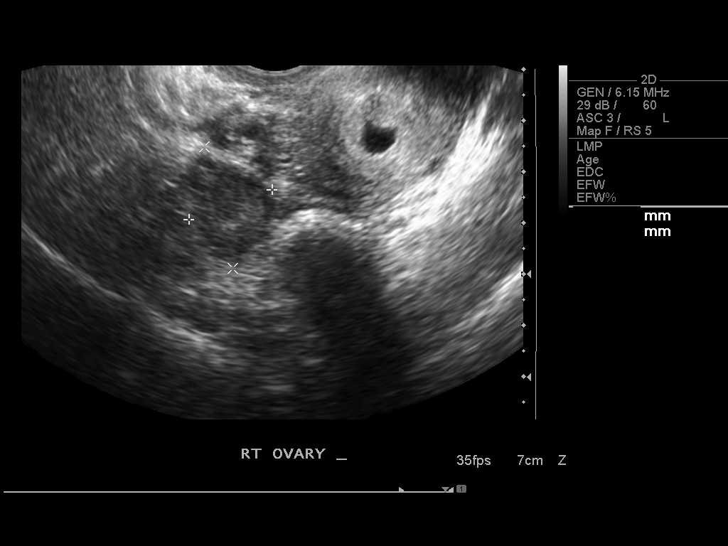
[im 61/72]
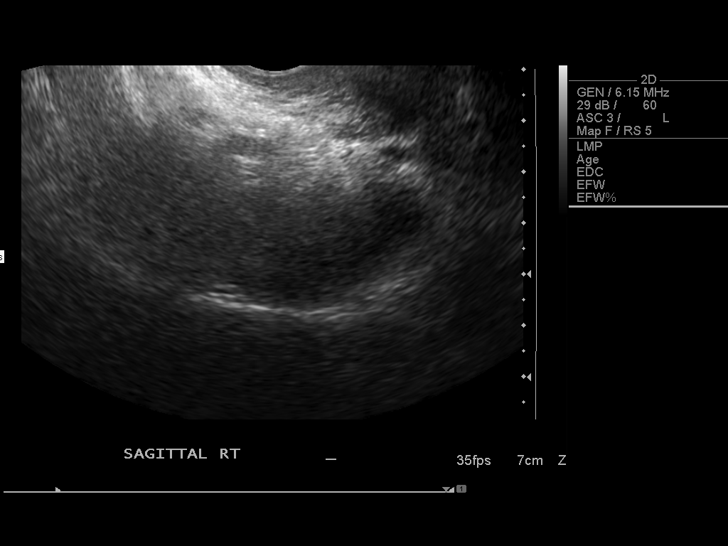
[im 66/72]
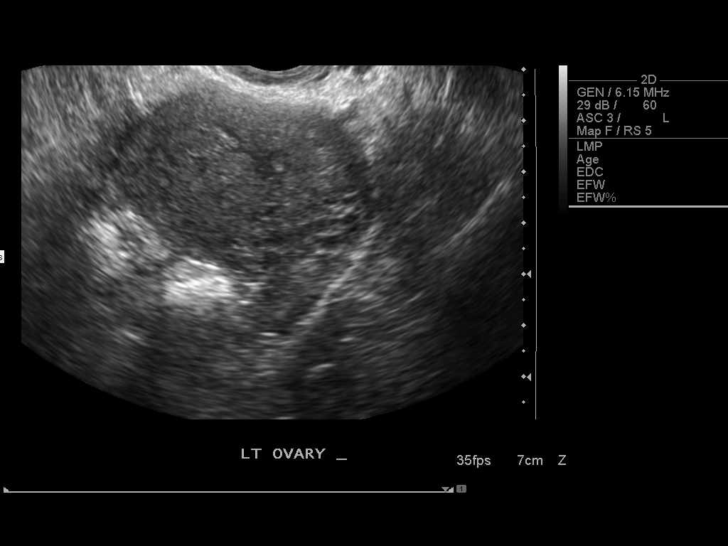
[im 72/72]
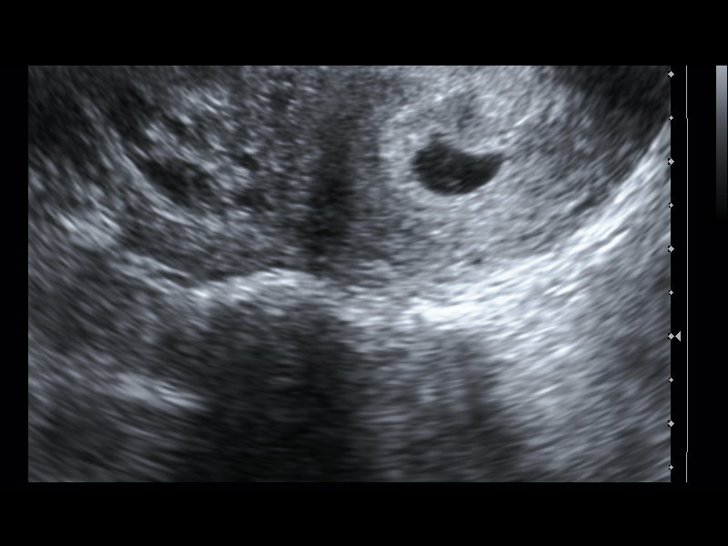

[14 of 28 positions shown; findings below may reference images not displayed]

Intrauterine gestational sac:  Visualized but within the lower
uterine segment near the cervix
Yolk sac: Present
Embryo: Not visualized

MSD: 11.8 mm  6 w 0 d     US EDC: 05/13/2013

Maternal uterus/adnexae:
There is no evidence of subchorionic hemorrhage.
The ovaries bilaterally are unremarkable.
IMPRESSION: Single intrauterine gestational sac with yolk sac, but located
within the lower uterine segment near the cervix - poor prognostic
sign. No embryo identified at this time.  Estimated gestational age
of 6 weeks 0 days by this ultrasound.
